# Patient Record
Sex: Male | Born: 1965 | Race: White | Hispanic: No | Marital: Married | Smoking: Former smoker
Health system: Southern US, Community
[De-identification: ages and names within clinical notes are randomized; demographics above are authoritative.]

## PROBLEM LIST (undated history)

## (undated) DIAGNOSIS — I10 Essential (primary) hypertension: Secondary | ICD-10-CM

## (undated) DIAGNOSIS — Z8719 Personal history of other diseases of the digestive system: Secondary | ICD-10-CM

## (undated) DIAGNOSIS — K219 Gastro-esophageal reflux disease without esophagitis: Secondary | ICD-10-CM

## (undated) DIAGNOSIS — Z87442 Personal history of urinary calculi: Secondary | ICD-10-CM

## (undated) DIAGNOSIS — R7303 Prediabetes: Secondary | ICD-10-CM

---

## 2002-08-10 HISTORY — PX: VASECTOMY: SHX75

## 2005-01-01 ENCOUNTER — Emergency Department (HOSPITAL_COMMUNITY): Admission: EM | Admit: 2005-01-01 | Discharge: 2005-01-01 | Payer: Self-pay | Admitting: Emergency Medicine

## 2006-05-19 IMAGING — CT CT ABDOMEN W/O CM
1 of 2 series · 15 of 32 positions shown, 19 images · non-contrast
Comparison: none

CLINICAL DATA: Left flank pain

ABDOMEN CT WITHOUT CONTRAST - URINARY STONE PROTOCOL
TECHNIQUE: Multidetector CT imaging of the abdomen was performed following the
urinary stone protocol.  No oral or intravenous contrast was administered.
TECHNIQUE: Multidetector CT imaging of the pelvis was performed following the

[Series 2: abd pelvis · axial · 0.74mm/px · z∈[-470,-65]mm · 15 of 91 slices shown, 19 images]
[im 5/91  soft-tissue]
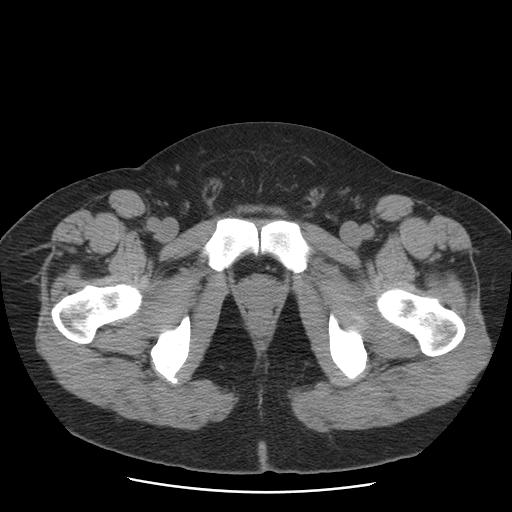
[im 5/91  bone]
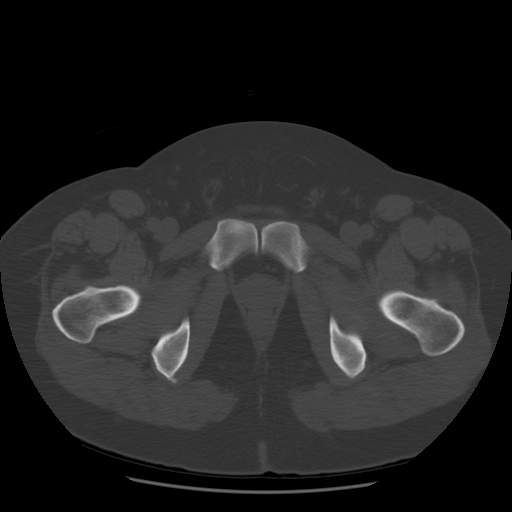
[im 13/91  soft-tissue]
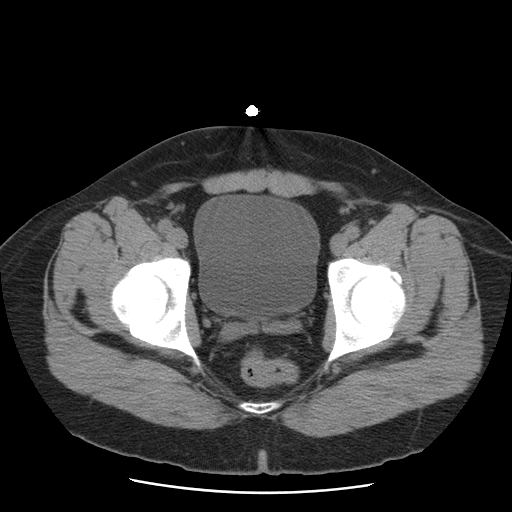
[im 21/91  soft-tissue]
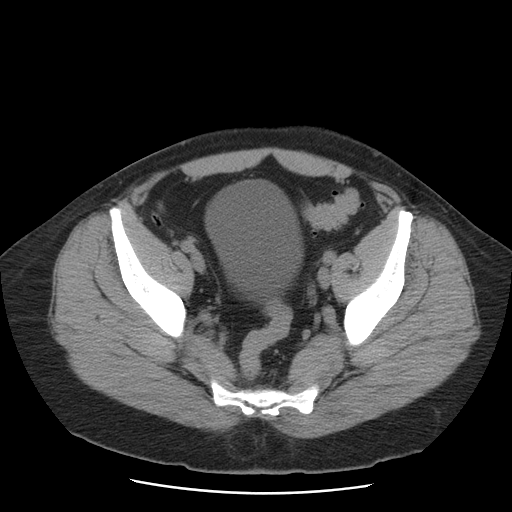
[im 25/91  soft-tissue]
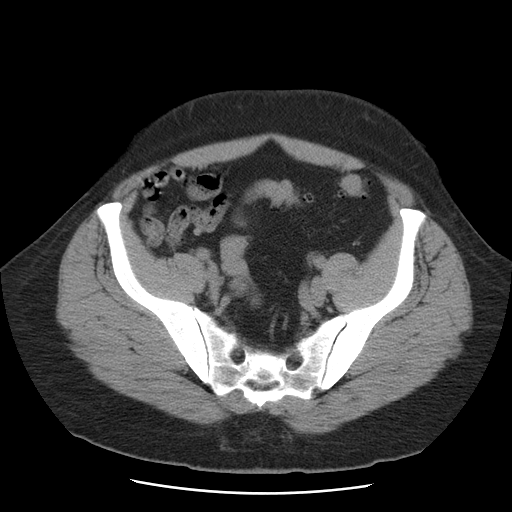
[im 33/91  soft-tissue]
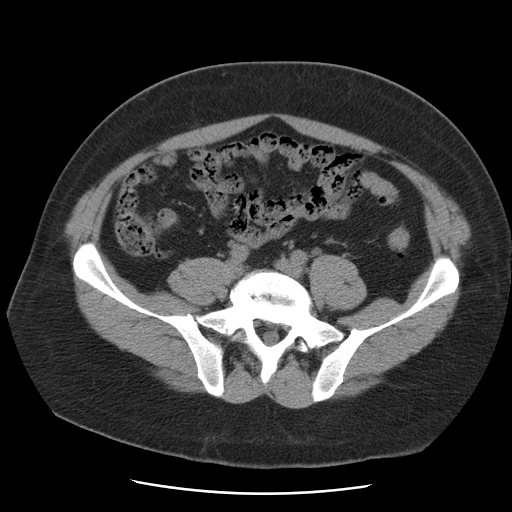
[im 37/91  soft-tissue]
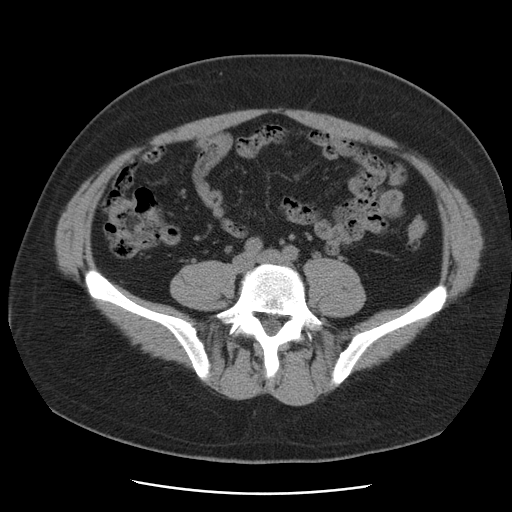
[im 46/91  soft-tissue]
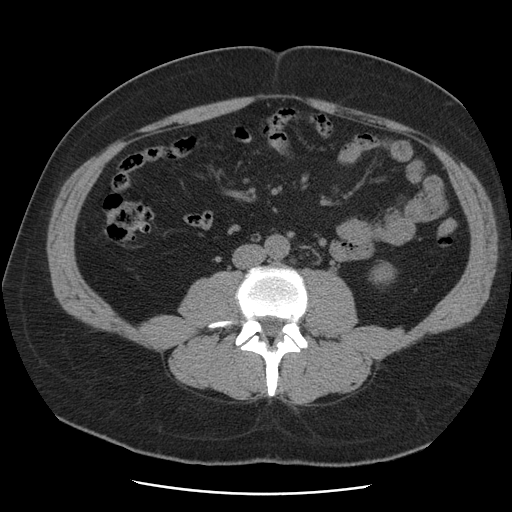
[im 54/91  soft-tissue]
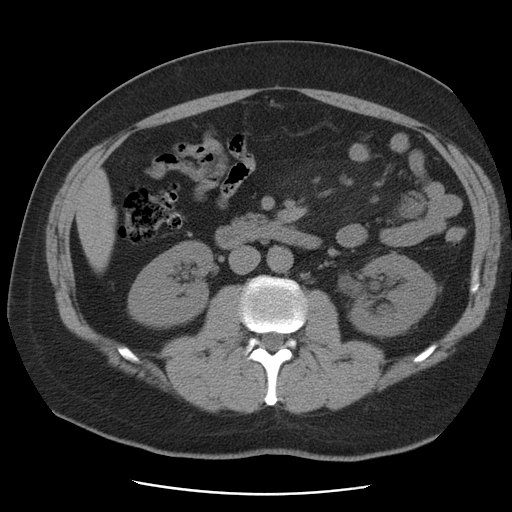
[im 58/91  soft-tissue]
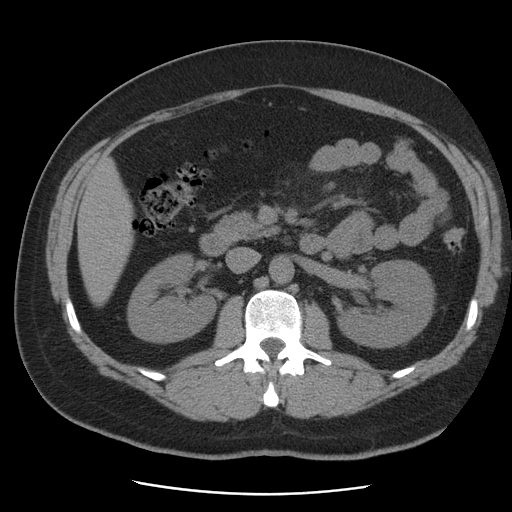
[im 58/91  bone]
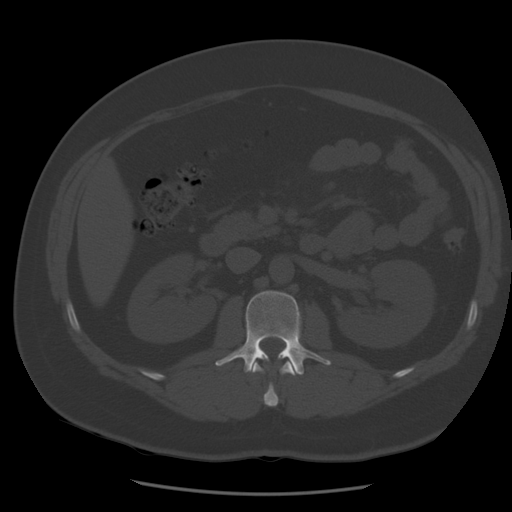
[im 66/91  soft-tissue]
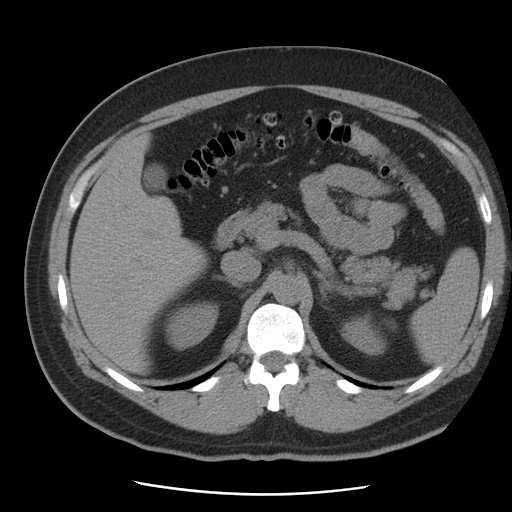
[im 70/91  soft-tissue]
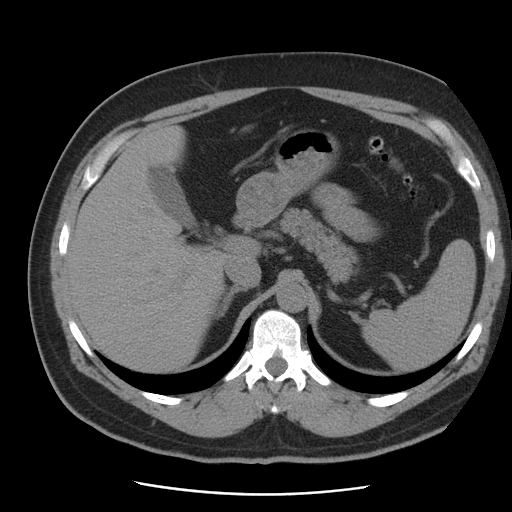
[im 74/91  lung]
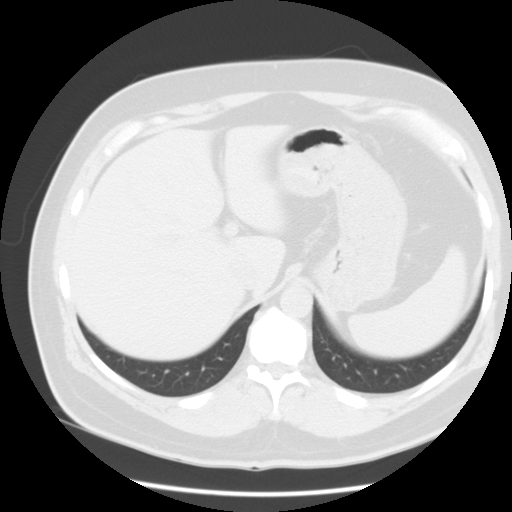
[im 78/91  soft-tissue]
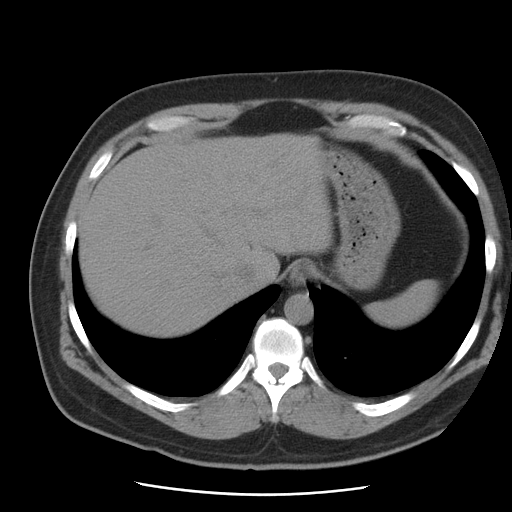
[im 78/91  lung]
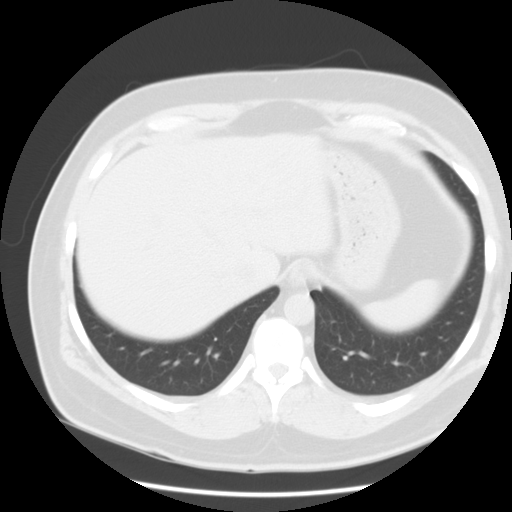
[im 82/91  lung]
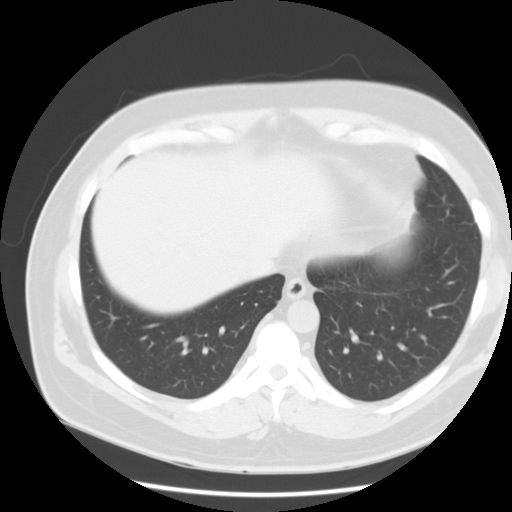
[im 86/91  soft-tissue]
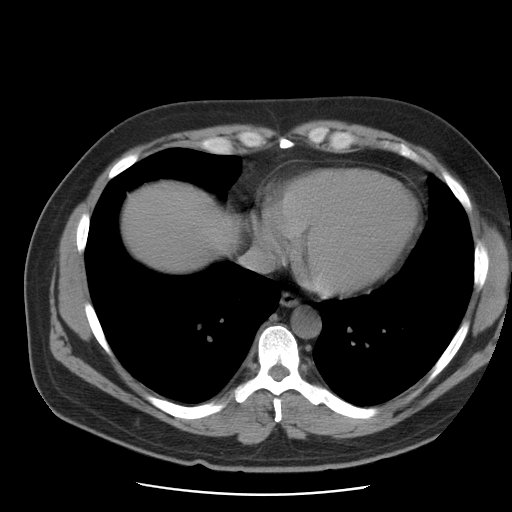
[im 86/91  lung]
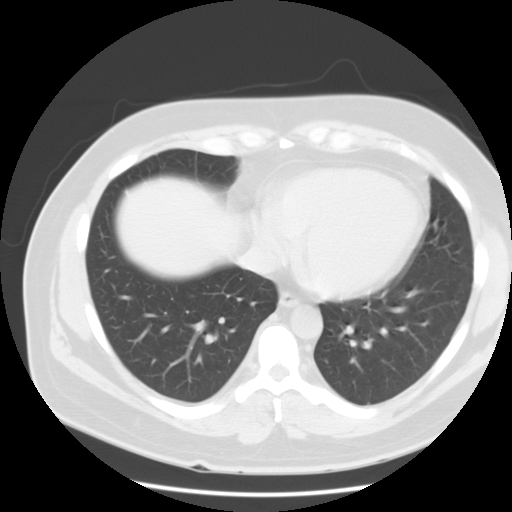

[15 of 32 positions shown; findings below may reference images not displayed]

FINDINGS: There is mild left hydroureteronephrosis. No stones seen in the
kidneys or proximal ureters. See pelvic CT report below.

Extensive diverticular disease seen throughout the entire colon. No evidence of
active diverticulitis. Eye orbits have an unremarkable uninfused appearance.

IMPRESSION

Mild left hydroureteronephrosis. 

Diffuse colonic diverticulosis.

PELVIS CT WITHOUT CONTRAST - URINARY STONE PROTOCOL
FINDINGS: There is a punctate stone at the left ureterovesical junction. Right
ureter unremarkable. Extensive diverticular disease seen throughout the colon.
No free fluid, free air, or adenopathy.

IMPRESSION

Punctate stone left UVJ.

## 2016-09-01 DIAGNOSIS — I10 Essential (primary) hypertension: Secondary | ICD-10-CM | POA: Diagnosis not present

## 2016-09-01 DIAGNOSIS — Z1211 Encounter for screening for malignant neoplasm of colon: Secondary | ICD-10-CM | POA: Diagnosis not present

## 2016-09-01 DIAGNOSIS — E559 Vitamin D deficiency, unspecified: Secondary | ICD-10-CM | POA: Diagnosis not present

## 2016-09-01 DIAGNOSIS — E785 Hyperlipidemia, unspecified: Secondary | ICD-10-CM | POA: Diagnosis not present

## 2016-10-27 DIAGNOSIS — D126 Benign neoplasm of colon, unspecified: Secondary | ICD-10-CM | POA: Diagnosis not present

## 2016-10-27 DIAGNOSIS — Z1211 Encounter for screening for malignant neoplasm of colon: Secondary | ICD-10-CM | POA: Diagnosis not present

## 2016-10-27 DIAGNOSIS — K529 Noninfective gastroenteritis and colitis, unspecified: Secondary | ICD-10-CM | POA: Diagnosis not present

## 2016-10-29 DIAGNOSIS — D126 Benign neoplasm of colon, unspecified: Secondary | ICD-10-CM | POA: Diagnosis not present

## 2016-10-29 DIAGNOSIS — Z1211 Encounter for screening for malignant neoplasm of colon: Secondary | ICD-10-CM | POA: Diagnosis not present

## 2016-10-29 DIAGNOSIS — K529 Noninfective gastroenteritis and colitis, unspecified: Secondary | ICD-10-CM | POA: Diagnosis not present

## 2016-12-03 DIAGNOSIS — K5730 Diverticulosis of large intestine without perforation or abscess without bleeding: Secondary | ICD-10-CM | POA: Diagnosis not present

## 2016-12-03 DIAGNOSIS — K529 Noninfective gastroenteritis and colitis, unspecified: Secondary | ICD-10-CM | POA: Diagnosis not present

## 2016-12-03 DIAGNOSIS — Z86010 Personal history of colonic polyps: Secondary | ICD-10-CM | POA: Diagnosis not present

## 2017-04-28 DIAGNOSIS — H5213 Myopia, bilateral: Secondary | ICD-10-CM | POA: Diagnosis not present

## 2017-08-26 DIAGNOSIS — E559 Vitamin D deficiency, unspecified: Secondary | ICD-10-CM | POA: Diagnosis not present

## 2017-08-26 DIAGNOSIS — K429 Umbilical hernia without obstruction or gangrene: Secondary | ICD-10-CM | POA: Diagnosis not present

## 2017-08-26 DIAGNOSIS — E785 Hyperlipidemia, unspecified: Secondary | ICD-10-CM | POA: Diagnosis not present

## 2017-08-26 DIAGNOSIS — I10 Essential (primary) hypertension: Secondary | ICD-10-CM | POA: Diagnosis not present

## 2017-09-28 DIAGNOSIS — E559 Vitamin D deficiency, unspecified: Secondary | ICD-10-CM | POA: Diagnosis not present

## 2017-09-28 DIAGNOSIS — E785 Hyperlipidemia, unspecified: Secondary | ICD-10-CM | POA: Diagnosis not present

## 2017-09-28 DIAGNOSIS — I10 Essential (primary) hypertension: Secondary | ICD-10-CM | POA: Diagnosis not present

## 2017-09-28 DIAGNOSIS — Z125 Encounter for screening for malignant neoplasm of prostate: Secondary | ICD-10-CM | POA: Diagnosis not present

## 2017-10-19 DIAGNOSIS — F4322 Adjustment disorder with anxiety: Secondary | ICD-10-CM | POA: Diagnosis not present

## 2017-10-26 DIAGNOSIS — F4322 Adjustment disorder with anxiety: Secondary | ICD-10-CM | POA: Diagnosis not present

## 2017-11-18 DIAGNOSIS — F4322 Adjustment disorder with anxiety: Secondary | ICD-10-CM | POA: Diagnosis not present

## 2017-12-09 DIAGNOSIS — F4322 Adjustment disorder with anxiety: Secondary | ICD-10-CM | POA: Diagnosis not present

## 2018-02-03 DIAGNOSIS — E785 Hyperlipidemia, unspecified: Secondary | ICD-10-CM | POA: Diagnosis not present

## 2018-02-03 DIAGNOSIS — I10 Essential (primary) hypertension: Secondary | ICD-10-CM | POA: Diagnosis not present

## 2018-02-03 DIAGNOSIS — E669 Obesity, unspecified: Secondary | ICD-10-CM | POA: Diagnosis not present

## 2018-02-03 DIAGNOSIS — E559 Vitamin D deficiency, unspecified: Secondary | ICD-10-CM | POA: Diagnosis not present

## 2018-04-28 DIAGNOSIS — E785 Hyperlipidemia, unspecified: Secondary | ICD-10-CM | POA: Diagnosis not present

## 2018-04-28 DIAGNOSIS — E559 Vitamin D deficiency, unspecified: Secondary | ICD-10-CM | POA: Diagnosis not present

## 2018-04-28 DIAGNOSIS — E669 Obesity, unspecified: Secondary | ICD-10-CM | POA: Diagnosis not present

## 2018-04-28 DIAGNOSIS — I10 Essential (primary) hypertension: Secondary | ICD-10-CM | POA: Diagnosis not present

## 2018-04-28 DIAGNOSIS — Z1159 Encounter for screening for other viral diseases: Secondary | ICD-10-CM | POA: Diagnosis not present

## 2019-02-07 DIAGNOSIS — F4321 Adjustment disorder with depressed mood: Secondary | ICD-10-CM | POA: Diagnosis not present

## 2019-02-07 DIAGNOSIS — E785 Hyperlipidemia, unspecified: Secondary | ICD-10-CM | POA: Diagnosis not present

## 2019-02-07 DIAGNOSIS — I10 Essential (primary) hypertension: Secondary | ICD-10-CM | POA: Diagnosis not present

## 2019-02-07 DIAGNOSIS — E559 Vitamin D deficiency, unspecified: Secondary | ICD-10-CM | POA: Diagnosis not present

## 2019-08-07 DIAGNOSIS — Z20828 Contact with and (suspected) exposure to other viral communicable diseases: Secondary | ICD-10-CM | POA: Diagnosis not present

## 2019-12-04 DIAGNOSIS — E669 Obesity, unspecified: Secondary | ICD-10-CM | POA: Diagnosis not present

## 2019-12-04 DIAGNOSIS — E559 Vitamin D deficiency, unspecified: Secondary | ICD-10-CM | POA: Diagnosis not present

## 2019-12-04 DIAGNOSIS — I10 Essential (primary) hypertension: Secondary | ICD-10-CM | POA: Diagnosis not present

## 2019-12-04 DIAGNOSIS — E785 Hyperlipidemia, unspecified: Secondary | ICD-10-CM | POA: Diagnosis not present

## 2019-12-14 DIAGNOSIS — Z131 Encounter for screening for diabetes mellitus: Secondary | ICD-10-CM | POA: Diagnosis not present

## 2019-12-14 DIAGNOSIS — I10 Essential (primary) hypertension: Secondary | ICD-10-CM | POA: Diagnosis not present

## 2019-12-14 DIAGNOSIS — Z0183 Encounter for blood typing: Secondary | ICD-10-CM | POA: Diagnosis not present

## 2019-12-14 DIAGNOSIS — E785 Hyperlipidemia, unspecified: Secondary | ICD-10-CM | POA: Diagnosis not present

## 2019-12-14 DIAGNOSIS — E559 Vitamin D deficiency, unspecified: Secondary | ICD-10-CM | POA: Diagnosis not present

## 2020-07-02 DIAGNOSIS — I10 Essential (primary) hypertension: Secondary | ICD-10-CM | POA: Diagnosis not present

## 2020-07-02 DIAGNOSIS — E785 Hyperlipidemia, unspecified: Secondary | ICD-10-CM | POA: Diagnosis not present

## 2020-07-02 DIAGNOSIS — E559 Vitamin D deficiency, unspecified: Secondary | ICD-10-CM | POA: Diagnosis not present

## 2020-07-02 DIAGNOSIS — E669 Obesity, unspecified: Secondary | ICD-10-CM | POA: Diagnosis not present

## 2021-04-21 DIAGNOSIS — Z713 Dietary counseling and surveillance: Secondary | ICD-10-CM | POA: Diagnosis not present

## 2021-04-21 DIAGNOSIS — E559 Vitamin D deficiency, unspecified: Secondary | ICD-10-CM | POA: Diagnosis not present

## 2021-04-21 DIAGNOSIS — E669 Obesity, unspecified: Secondary | ICD-10-CM | POA: Diagnosis not present

## 2021-04-21 DIAGNOSIS — Z125 Encounter for screening for malignant neoplasm of prostate: Secondary | ICD-10-CM | POA: Diagnosis not present

## 2021-04-21 DIAGNOSIS — E7800 Pure hypercholesterolemia, unspecified: Secondary | ICD-10-CM | POA: Diagnosis not present

## 2021-04-21 DIAGNOSIS — Z79899 Other long term (current) drug therapy: Secondary | ICD-10-CM | POA: Diagnosis not present

## 2021-04-24 DIAGNOSIS — Z0000 Encounter for general adult medical examination without abnormal findings: Secondary | ICD-10-CM | POA: Diagnosis not present

## 2021-04-24 DIAGNOSIS — R635 Abnormal weight gain: Secondary | ICD-10-CM | POA: Diagnosis not present

## 2021-04-24 DIAGNOSIS — I10 Essential (primary) hypertension: Secondary | ICD-10-CM | POA: Diagnosis not present

## 2021-04-24 DIAGNOSIS — E559 Vitamin D deficiency, unspecified: Secondary | ICD-10-CM | POA: Diagnosis not present

## 2021-04-24 DIAGNOSIS — E7800 Pure hypercholesterolemia, unspecified: Secondary | ICD-10-CM | POA: Diagnosis not present

## 2021-05-12 ENCOUNTER — Ambulatory Visit: Payer: Self-pay | Admitting: Surgery

## 2021-05-12 DIAGNOSIS — K436 Other and unspecified ventral hernia with obstruction, without gangrene: Secondary | ICD-10-CM | POA: Diagnosis not present

## 2021-05-12 DIAGNOSIS — K4020 Bilateral inguinal hernia, without obstruction or gangrene, not specified as recurrent: Secondary | ICD-10-CM | POA: Diagnosis not present

## 2021-05-12 DIAGNOSIS — Z6837 Body mass index (BMI) 37.0-37.9, adult: Secondary | ICD-10-CM | POA: Diagnosis not present

## 2021-05-12 DIAGNOSIS — M6208 Separation of muscle (nontraumatic), other site: Secondary | ICD-10-CM | POA: Diagnosis not present

## 2021-10-14 DIAGNOSIS — Z86010 Personal history of colonic polyps: Secondary | ICD-10-CM | POA: Diagnosis not present

## 2021-10-14 DIAGNOSIS — K5730 Diverticulosis of large intestine without perforation or abscess without bleeding: Secondary | ICD-10-CM | POA: Diagnosis not present

## 2021-10-14 DIAGNOSIS — K648 Other hemorrhoids: Secondary | ICD-10-CM | POA: Diagnosis not present

## 2021-10-28 DIAGNOSIS — E7800 Pure hypercholesterolemia, unspecified: Secondary | ICD-10-CM | POA: Diagnosis not present

## 2021-10-28 DIAGNOSIS — Z6836 Body mass index (BMI) 36.0-36.9, adult: Secondary | ICD-10-CM | POA: Diagnosis not present

## 2021-10-28 DIAGNOSIS — E559 Vitamin D deficiency, unspecified: Secondary | ICD-10-CM | POA: Diagnosis not present

## 2021-10-28 DIAGNOSIS — R7303 Prediabetes: Secondary | ICD-10-CM | POA: Diagnosis not present

## 2021-10-28 DIAGNOSIS — I10 Essential (primary) hypertension: Secondary | ICD-10-CM | POA: Diagnosis not present

## 2021-10-28 DIAGNOSIS — Z79899 Other long term (current) drug therapy: Secondary | ICD-10-CM | POA: Diagnosis not present

## 2021-10-29 ENCOUNTER — Encounter (HOSPITAL_COMMUNITY): Payer: Self-pay

## 2021-10-29 ENCOUNTER — Other Ambulatory Visit: Payer: Self-pay

## 2021-10-29 NOTE — Patient Instructions (Signed)
DUE TO COVID-19 ONLY ONE VISITOR  (aged 56 and older)  IS ALLOWED TO COME WITH YOU AND STAY IN THE WAITING ROOM ONLY DURING PRE OP AND PROCEDURE.   ?**NO VISITORS ARE ALLOWED IN THE SHORT STAY AREA OR RECOVERY ROOM!!** ? ?IF YOU WILL BE ADMITTED INTO THE HOSPITAL YOU ARE ALLOWED ONLY TWO SUPPORT PEOPLE DURING VISITATION HOURS ONLY (7 AM -8PM)   ?The support person(s) must pass our screening, gel in and out, and wear a mask at all times, including in the patient?s room. ?Patients must also wear a mask when staff or their support person are in the room. ?Visitors GUEST BADGE MUST BE WORN VISIBLY  ?One adult visitor may remain with you overnight and MUST be in the room by 8 P.M. ?  ? ? Your procedure is scheduled on: 11/07/21 ? ? Report to [facility] Main Entrance ? ?  Report to admitting at  11:00 AM ? ? Call this number if you have problems the morning of surgery [phone] ? ? Do not eat food :After Midnight. ? ? After Midnight you may have the following liquids until _10:00_____ AM DAY OF SURGERY ? ?Water ?Black Coffee (sugar ok, NO MILK/CREAM OR CREAMERS)  ?Tea (sugar ok, NO MILK/CREAM OR CREAMERS) regular and decaf                             ?Plain Jell-O (NO RED)                                           ?Fruit ices (not with fruit pulp, NO RED)                                     ?Popsicles (NO RED)                                                                  ?Juice: apple, WHITE grape, WHITE cranberry ?Sports drinks like Gatorade (NO RED) ?Clear broth(vegetable,chicken,beef) ? ?             ? ?  ?       If you have questions, please contact your surgeon?s office. ? ? ?FOLLOW BOWEL PREP AND ANY ADDITIONAL PRE OP INSTRUCTIONS YOU RECEIVED FROM YOUR SURGEON'S OFFICE!!! ?  ?  ?Oral Hygiene is also important to reduce your risk of infection.                                    ?Remember - BRUSH YOUR TEETH THE MORNING OF SURGERY WITH YOUR REGULAR TOOTHPASTE ? ? Do NOT smoke after Midnight ? ? Take  these medicines the morning of surgery with A SIP OF WATER: Amlodipine ? ? ?                  ?           You may not have any metal on your body including hair pins,  jewelry, and body piercing ? ?           Do not wear make-up, lotions, powders, perfumes/cologne, or deodorant ?  ?  ? ?            Men may shave face and neck. ? ? Do not bring valuables to the hospital. [facility] IS NOT ?            RESPONSIBLE   FOR VALUABLES. ? ? Contacts, dentures or bridgework may not be worn into surgery. ? ?  ? Patients discharged on the day of surgery will not be allowed to drive home.  Someone NEEDS to stay with you for the first 24 hours after anesthesia. ? ? Special Instructions: Bring a copy of your healthcare power of attorney and living will documents  the day of surgery if you haven't scanned them before. ? ?            Please read over the following fact sheets you were given: IF YOU HAVE QUESTIONS ABOUT YOUR PRE-OP INSTRUCTIONS PLEASE CALL [phone] ? ?   [facility] [facility] - Preparing for Surgery ?Before surgery, you can play an important role.  Because skin is not sterile, your skin needs to be as free of germs as possible.  You can reduce the number of germs on your skin by washing with CHG (chlorahexidine gluconate) soap before surgery.  CHG is an antiseptic cleaner which kills germs and bonds with the skin to continue killing germs even after washing. ?Please DO NOT use if you have an allergy to CHG or antibacterial soaps.  If your skin becomes reddened/irritated stop using the CHG and inform your nurse when you arrive at Short Stay.  You may shave your face/neck. ?Please follow these instructions carefully: ? 1.  Shower with CHG Soap the night before surgery and the  morning of Surgery. ? 2.  If you choose to wash your hair, wash your hair first as usual with your  normal  shampoo. ? 3.  After you shampoo, rinse your hair and body thoroughly to remove the  shampoo.                           ? 4.  Use CHG as you  would any other liquid soap.  You can apply chg directly  to the skin and wash  ?                     Gently with a scrungie or clean washcloth. ? 5.  Apply the CHG Soap to your body ONLY FROM THE NECK DOWN.   Do not use on face/ open      ?                     Wound or open sores. Avoid contact with eyes, ears mouth and genitals (private parts).  ?                     Engineering geologist,  Genitals (private parts) with your normal soap. ?            6.  Wash thoroughly, paying special attention to the area where your surgery  will be performed. ? 7.  Thoroughly rinse your body with warm water from the neck down. ? 8.  DO NOT shower/wash with your normal soap after using and rinsing off  the CHG Soap. ?  9.  Pat yourself dry with a clean towel. ?           10.  Wear clean pajamas. ?           11.  Place clean sheets on your bed the night of your first shower and do not  sleep with pets. ?Day of Surgery : ?Do not apply any lotions/deodorants the morning of surgery.  Please wear clean clothes to the hospital/surgery center. ? ?FAILURE TO FOLLOW THESE INSTRUCTIONS [place] RESULT IN THE CANCELLATION OF YOUR SURGERY ?PATIENT SIGNATURE_________________________________ ? ?NURSE SIGNATURE__________________________________ ? ?________________________________________________________________________  ?

## 2021-10-30 ENCOUNTER — Encounter (HOSPITAL_COMMUNITY): Payer: Self-pay

## 2021-10-30 ENCOUNTER — Encounter (INDEPENDENT_AMBULATORY_CARE_PROVIDER_SITE_OTHER): Payer: Self-pay

## 2021-10-30 ENCOUNTER — Other Ambulatory Visit: Payer: Self-pay

## 2021-10-30 ENCOUNTER — Encounter (HOSPITAL_COMMUNITY)
Admission: RE | Admit: 2021-10-30 | Discharge: 2021-10-30 | Disposition: A | Discharge status: Home / Self Care | Payer: BC Managed Care – PPO | Source: Ambulatory Visit | Attending: Surgery | Admitting: Surgery

## 2021-10-30 VITALS — HR 71 | Temp 99.1°F | Resp 16

## 2021-10-30 DIAGNOSIS — R7303 Prediabetes: Secondary | ICD-10-CM | POA: Insufficient documentation

## 2021-10-30 DIAGNOSIS — Z01818 Encounter for other preprocedural examination: Secondary | ICD-10-CM | POA: Insufficient documentation

## 2021-10-30 HISTORY — DX: Personal history of other diseases of the digestive system: Z87.19

## 2021-10-30 HISTORY — DX: Personal history of urinary calculi: Z87.442

## 2021-10-30 HISTORY — DX: Gastro-esophageal reflux disease without esophagitis: K21.9

## 2021-10-30 HISTORY — DX: Prediabetes: R73.03

## 2021-10-30 HISTORY — DX: Essential (primary) hypertension: I10

## 2021-10-30 LAB — BASIC METABOLIC PANEL
Anion gap: 7 (ref 5–15)
BUN: 20 mg/dL (ref 6–20)
CO2: 28 mmol/L (ref 22–32)
Calcium: 9.7 mg/dL (ref 8.9–10.3)
Chloride: 104 mmol/L (ref 98–111)
Creatinine, Ser: 0.96 mg/dL (ref 0.61–1.24)
GFR, Estimated: >60 mL/min (ref >60)
Glucose, Bld: 106 mg/dL — ABNORMAL HIGH (ref 70–99)
Potassium: 3.3 mmol/L — ABNORMAL LOW (ref 3.5–5.1)
Sodium: 139 mmol/L (ref 135–145)

## 2021-10-30 LAB — HEMOGLOBIN A1C
Hgb A1c MFr Bld: 5.5 % (ref 4.8–5.6)
Mean Plasma Glucose: 111.15 mg/dL

## 2021-10-30 LAB — CBC
HCT: 47.8 % (ref 39.0–52.0)
Hemoglobin: 15.9 g/dL (ref 13.0–17.0)
MCH: 28.5 pg (ref 26.0–34.0)
MCHC: 33.3 g/dL (ref 30.0–36.0)
MCV: 85.7 fL (ref 80.0–100.0)
Platelets: 214 10*3/uL (ref 150–400)
RBC: 5.58 MIL/uL (ref 4.22–5.81)
RDW: 12.8 % (ref 11.5–15.5)
WBC: 7.5 10*3/uL (ref 4.0–10.5)
nRBC: 0 % (ref 0.0–0.2)

## 2021-10-30 LAB — GLUCOSE, CAPILLARY: Glucose-Capillary: 117 mg/dL — ABNORMAL HIGH (ref 70–99)

## 2021-10-30 NOTE — Progress Notes (Signed)
?  Bowel prep reminder:NA ? ?PCP - Dr. Karna Dupes ?Cardiologist - no ? ?Chest x-ray - no ?EKG - 10/30/21-chart ?Stress Test - no ?ECHO - no ?Cardiac Cath - no ?Pacemaker/ICD device last checked:NA ? ?Sleep Study - no ?CPAP -  ? ?Fasting Blood Sugar - pre-diabetic ?Checks Blood Sugar _____ times a day ? ?Blood Thinner Instructions:NA ?Aspirin Instructions: ?Last Dose: ? ?Anesthesia review: no ? ?Patient denies shortness of breath, fever, cough and chest pain at PAT appointment ?Pt has no SOB with activities. ? ?Patient verbalized understanding of instructions that were given to them at the PAT appointment. Patient was also instructed that they will need to review over the PAT instructions again at home before surgery. yes ?

## 2021-11-07 ENCOUNTER — Encounter (HOSPITAL_COMMUNITY): Payer: Self-pay | Admitting: Surgery

## 2021-11-07 ENCOUNTER — Other Ambulatory Visit: Payer: Self-pay

## 2021-11-07 ENCOUNTER — Ambulatory Visit (HOSPITAL_COMMUNITY): Payer: BC Managed Care – PPO | Admitting: Certified Registered"

## 2021-11-07 ENCOUNTER — Encounter (HOSPITAL_COMMUNITY)
Admission: RE | Disposition: A | Discharge status: Home / Self Care | Payer: Self-pay | Source: Ambulatory Visit | Attending: Surgery

## 2021-11-07 ENCOUNTER — Ambulatory Visit (HOSPITAL_COMMUNITY)
Admission: RE | Admit: 2021-11-07 | Discharge: 2021-11-07 | Disposition: A | Discharge status: Home / Self Care | Payer: BC Managed Care – PPO | Source: Ambulatory Visit | Attending: Surgery | Admitting: Surgery

## 2021-11-07 DIAGNOSIS — K4190 Unilateral femoral hernia, without obstruction or gangrene, not specified as recurrent: Secondary | ICD-10-CM | POA: Insufficient documentation

## 2021-11-07 DIAGNOSIS — M6208 Separation of muscle (nontraumatic), other site: Secondary | ICD-10-CM | POA: Diagnosis not present

## 2021-11-07 DIAGNOSIS — Z87891 Personal history of nicotine dependence: Secondary | ICD-10-CM | POA: Diagnosis not present

## 2021-11-07 DIAGNOSIS — K436 Other and unspecified ventral hernia with obstruction, without gangrene: Secondary | ICD-10-CM | POA: Diagnosis not present

## 2021-11-07 DIAGNOSIS — K4020 Bilateral inguinal hernia, without obstruction or gangrene, not specified as recurrent: Secondary | ICD-10-CM | POA: Insufficient documentation

## 2021-11-07 DIAGNOSIS — Z6835 Body mass index (BMI) 35.0-35.9, adult: Secondary | ICD-10-CM | POA: Insufficient documentation

## 2021-11-07 DIAGNOSIS — E669 Obesity, unspecified: Secondary | ICD-10-CM | POA: Insufficient documentation

## 2021-11-07 DIAGNOSIS — I10 Essential (primary) hypertension: Secondary | ICD-10-CM | POA: Insufficient documentation

## 2021-11-07 DIAGNOSIS — Z79899 Other long term (current) drug therapy: Secondary | ICD-10-CM | POA: Insufficient documentation

## 2021-11-07 HISTORY — PX: VENTRAL HERNIA REPAIR: SHX424

## 2021-11-07 HISTORY — PX: INGUINAL HERNIA REPAIR: SHX194

## 2021-11-07 LAB — GLUCOSE, CAPILLARY: Glucose-Capillary: 160 mg/dL — ABNORMAL HIGH (ref 70–99)

## 2021-11-07 SURGERY — REPAIR, HERNIA, VENTRAL, LAPAROSCOPIC
Anesthesia: General

## 2021-11-07 MED ORDER — ROCURONIUM BROMIDE 10 MG/ML (PF) SYRINGE
PREFILLED_SYRINGE | INTRAVENOUS | Status: AC
  Filled 2021-11-07: qty 10

## 2021-11-07 MED ORDER — GENTAMICIN SULFATE 40 MG/ML IJ SOLN
5.0000 mg/kg | INTRAVENOUS | Status: AC
  Administered 2021-11-07: 460 mg via INTRAVENOUS
  Filled 2021-11-07: qty 11.5

## 2021-11-07 MED ORDER — KETOROLAC TROMETHAMINE 30 MG/ML IJ SOLN
INTRAMUSCULAR | Status: DC | PRN
  Administered 2021-11-07: 30 mg via INTRAVENOUS

## 2021-11-07 MED ORDER — PROPOFOL 10 MG/ML IV BOLUS
INTRAVENOUS | Status: DC | PRN
  Administered 2021-11-07: 200 mg via INTRAVENOUS

## 2021-11-07 MED ORDER — KETAMINE HCL 50 MG/5ML IJ SOSY
PREFILLED_SYRINGE | INTRAMUSCULAR | Status: AC
  Filled 2021-11-07: qty 5

## 2021-11-07 MED ORDER — KETAMINE HCL 10 MG/ML IJ SOLN
INTRAMUSCULAR | Status: DC | PRN
  Administered 2021-11-07 (×2): 15 mg via INTRAVENOUS

## 2021-11-07 MED ORDER — ROCURONIUM BROMIDE 10 MG/ML (PF) SYRINGE
PREFILLED_SYRINGE | INTRAVENOUS | Status: DC | PRN
  Administered 2021-11-07: 80 mg via INTRAVENOUS
  Administered 2021-11-07 (×2): 20 mg via INTRAVENOUS
  Administered 2021-11-07: 10 mg via INTRAVENOUS

## 2021-11-07 MED ORDER — ACETAMINOPHEN 500 MG PO TABS
1000.0000 mg | ORAL_TABLET | ORAL | Status: AC
  Administered 2021-11-07: 1000 mg via ORAL
  Filled 2021-11-07: qty 2

## 2021-11-07 MED ORDER — ENSURE PRE-SURGERY PO LIQD: 296.0000 mL | Freq: Once | ORAL | Status: DC

## 2021-11-07 MED ORDER — 0.9 % SODIUM CHLORIDE (POUR BTL) OPTIME
TOPICAL | Status: DC | PRN
  Administered 2021-11-07: 1000 mL

## 2021-11-07 MED ORDER — CHLORHEXIDINE GLUCONATE CLOTH 2 % EX PADS: 6.0000 | MEDICATED_PAD | Freq: Once | CUTANEOUS | Status: DC

## 2021-11-07 MED ORDER — LACTATED RINGERS IV SOLN: INTRAVENOUS | Status: DC

## 2021-11-07 MED ORDER — OXYCODONE HCL 5 MG/5ML PO SOLN: 5.0000 mg | Freq: Once | ORAL | Status: DC | PRN

## 2021-11-07 MED ORDER — MIDAZOLAM HCL 2 MG/2ML IJ SOLN
INTRAMUSCULAR | Status: AC
  Filled 2021-11-07: qty 2

## 2021-11-07 MED ORDER — ORAL CARE MOUTH RINSE: 15.0000 mL | Freq: Once | OROMUCOSAL | Status: AC

## 2021-11-07 MED ORDER — DEXAMETHASONE SODIUM PHOSPHATE 10 MG/ML IJ SOLN
INTRAMUSCULAR | Status: AC
  Filled 2021-11-07: qty 1

## 2021-11-07 MED ORDER — PROPOFOL 10 MG/ML IV BOLUS
INTRAVENOUS | Status: AC
  Filled 2021-11-07: qty 20

## 2021-11-07 MED ORDER — BUPIVACAINE LIPOSOME 1.3 % IJ SUSP
INTRAMUSCULAR | Status: AC
  Filled 2021-11-07: qty 20

## 2021-11-07 MED ORDER — GABAPENTIN 300 MG PO CAPS
300.0000 mg | ORAL_CAPSULE | ORAL | Status: AC
Start: 2021-11-08 — End: 2021-11-07
  Administered 2021-11-07: 300 mg via ORAL
  Filled 2021-11-07: qty 1

## 2021-11-07 MED ORDER — SUGAMMADEX SODIUM 500 MG/5ML IV SOLN
INTRAVENOUS | Status: AC
  Filled 2021-11-07: qty 5

## 2021-11-07 MED ORDER — DEXAMETHASONE SODIUM PHOSPHATE 10 MG/ML IJ SOLN
INTRAMUSCULAR | Status: DC | PRN
  Administered 2021-11-07: 4 mg via INTRAVENOUS

## 2021-11-07 MED ORDER — FENTANYL CITRATE (PF) 100 MCG/2ML IJ SOLN
INTRAMUSCULAR | Status: DC | PRN
  Administered 2021-11-07: 25 ug via INTRAVENOUS
  Administered 2021-11-07: 50 ug via INTRAVENOUS
  Administered 2021-11-07: 100 ug via INTRAVENOUS
  Administered 2021-11-07: 25 ug via INTRAVENOUS
  Administered 2021-11-07: 50 ug via INTRAVENOUS
  Administered 2021-11-07 (×2): 25 ug via INTRAVENOUS

## 2021-11-07 MED ORDER — BUPIVACAINE LIPOSOME 1.3 % IJ SUSP
INTRAMUSCULAR | Status: DC | PRN
  Administered 2021-11-07: 20 mL

## 2021-11-07 MED ORDER — BUPIVACAINE-EPINEPHRINE 0.25% -1:200000 IJ SOLN
INTRAMUSCULAR | Status: DC | PRN
  Administered 2021-11-07: 60 mL

## 2021-11-07 MED ORDER — HYDROMORPHONE HCL 1 MG/ML IJ SOLN
INTRAMUSCULAR | Status: AC
  Filled 2021-11-07: qty 1

## 2021-11-07 MED ORDER — MIDAZOLAM HCL 2 MG/2ML IJ SOLN
INTRAMUSCULAR | Status: DC | PRN
  Administered 2021-11-07: 2 mg via INTRAVENOUS

## 2021-11-07 MED ORDER — SUGAMMADEX SODIUM 500 MG/5ML IV SOLN
INTRAVENOUS | Status: DC | PRN
  Administered 2021-11-07: 400 mg via INTRAVENOUS

## 2021-11-07 MED ORDER — ONDANSETRON HCL 4 MG/2ML IJ SOLN: 4.0000 mg | Freq: Once | INTRAMUSCULAR | Status: DC | PRN

## 2021-11-07 MED ORDER — OXYCODONE HCL 5 MG PO TABS: 5.0000 mg | ORAL_TABLET | Freq: Four times a day (QID) | ORAL | 0 refills | Status: AC | PRN

## 2021-11-07 MED ORDER — FENTANYL CITRATE (PF) 100 MCG/2ML IJ SOLN
INTRAMUSCULAR | Status: AC
  Filled 2021-11-07: qty 2

## 2021-11-07 MED ORDER — KETOROLAC TROMETHAMINE 30 MG/ML IJ SOLN: 30.0000 mg | Freq: Once | INTRAMUSCULAR | Status: DC | PRN

## 2021-11-07 MED ORDER — BUPIVACAINE-EPINEPHRINE (PF) 0.25% -1:200000 IJ SOLN
INTRAMUSCULAR | Status: AC
  Filled 2021-11-07: qty 60

## 2021-11-07 MED ORDER — FENTANYL CITRATE (PF) 100 MCG/2ML IJ SOLN
INTRAMUSCULAR | Status: AC
Start: 2021-11-07 — End: ?
  Filled 2021-11-07: qty 2

## 2021-11-07 MED ORDER — METHOCARBAMOL 750 MG PO TABS: 750.0000 mg | ORAL_TABLET | Freq: Four times a day (QID) | ORAL | 2 refills | Status: AC | PRN

## 2021-11-07 MED ORDER — BUPIVACAINE LIPOSOME 1.3 % IJ SUSP: 20.0000 mL | Freq: Once | INTRAMUSCULAR | Status: DC

## 2021-11-07 MED ORDER — CHLORHEXIDINE GLUCONATE 0.12 % MT SOLN
15.0000 mL | Freq: Once | OROMUCOSAL | Status: AC
  Administered 2021-11-07: 15 mL via OROMUCOSAL

## 2021-11-07 MED ORDER — CLINDAMYCIN PHOSPHATE 900 MG/50ML IV SOLN
900.0000 mg | INTRAVENOUS | Status: AC
  Administered 2021-11-07: 900 mg via INTRAVENOUS
  Filled 2021-11-07: qty 50

## 2021-11-07 MED ORDER — LIDOCAINE HCL (PF) 2 % IJ SOLN
INTRAMUSCULAR | Status: AC
  Filled 2021-11-07: qty 5

## 2021-11-07 MED ORDER — LIDOCAINE 2% (20 MG/ML) 5 ML SYRINGE
INTRAMUSCULAR | Status: DC | PRN
  Administered 2021-11-07: 100 mg via INTRAVENOUS

## 2021-11-07 MED ORDER — ONDANSETRON HCL 4 MG/2ML IJ SOLN
INTRAMUSCULAR | Status: DC | PRN
  Administered 2021-11-07: 4 mg via INTRAVENOUS

## 2021-11-07 MED ORDER — OXYCODONE HCL 5 MG PO TABS: 5.0000 mg | ORAL_TABLET | Freq: Once | ORAL | Status: DC | PRN

## 2021-11-07 MED ORDER — ONDANSETRON HCL 4 MG/2ML IJ SOLN
INTRAMUSCULAR | Status: AC
  Filled 2021-11-07: qty 2

## 2021-11-07 MED ORDER — ENSURE PRE-SURGERY PO LIQD: 592.0000 mL | Freq: Once | ORAL | Status: DC

## 2021-11-07 MED ORDER — HYDROMORPHONE HCL 1 MG/ML IJ SOLN
0.2500 mg | INTRAMUSCULAR | Status: DC | PRN
  Administered 2021-11-07: 0.25 mg via INTRAVENOUS

## 2021-11-07 SURGICAL SUPPLY — 54 items
APL PRP STRL LF DISP 70% ISPRP (MISCELLANEOUS) ×2
APPLIER CLIP 5 13 M/L LIGAMAX5 (MISCELLANEOUS)
APR CLP MED LRG 5 ANG JAW (MISCELLANEOUS)
BAG COUNTER SPONGE SURGICOUNT (BAG) ×3 IMPLANT
BAG SPNG CNTER NS LX DISP (BAG) ×2
BINDER ABDOMINAL 12 ML 46-62 (SOFTGOODS) ×1 IMPLANT
CABLE HIGH FREQUENCY MONO STRZ (ELECTRODE) ×3 IMPLANT
CHLORAPREP W/TINT 26 (MISCELLANEOUS) ×3 IMPLANT
CLIP APPLIE 5 13 M/L LIGAMAX5 (MISCELLANEOUS) IMPLANT
CLSR STERI-STRIP ANTIMIC 1/2X4 (GAUZE/BANDAGES/DRESSINGS) ×1 IMPLANT
COVER SURGICAL LIGHT HANDLE (MISCELLANEOUS) ×3 IMPLANT
DEVICE SECURE STRAP 25 ABSORB (INSTRUMENTS) ×1 IMPLANT
DEVICE TROCAR PUNCTURE CLOSURE (ENDOMECHANICALS) ×3 IMPLANT
DRAPE WARM FLUID 44X44 (DRAPES) ×3 IMPLANT
DRSG TEGADERM 2-3/8X2-3/4 SM (GAUZE/BANDAGES/DRESSINGS) ×7 IMPLANT
DRSG TEGADERM 4X4.75 (GAUZE/BANDAGES/DRESSINGS) ×3 IMPLANT
ELECT REM PT RETURN 15FT ADLT (MISCELLANEOUS) ×3 IMPLANT
GAUZE SPONGE 2X2 8PLY STRL LF (GAUZE/BANDAGES/DRESSINGS) ×2 IMPLANT
GLOVE SURG NEOPR MICRO LF SZ8 (GLOVE) ×3 IMPLANT
GLOVE SURG UNDER LTX SZ8 (GLOVE) ×3 IMPLANT
GOWN STRL REUS W/ TWL XL LVL3 (GOWN DISPOSABLE) ×6 IMPLANT
GOWN STRL REUS W/TWL XL LVL3 (GOWN DISPOSABLE) ×9
IRRIG SUCT STRYKERFLOW 2 WTIP (MISCELLANEOUS)
IRRIGATION SUCT STRKRFLW 2 WTP (MISCELLANEOUS) IMPLANT
KIT BASIN OR (CUSTOM PROCEDURE TRAY) ×3 IMPLANT
KIT TURNOVER KIT A (KITS) ×1 IMPLANT
MARKER SKIN DUAL TIP RULER LAB (MISCELLANEOUS) ×3 IMPLANT
MESH BARD SOFT 6X6IN (Mesh General) ×3 IMPLANT
MESH VENTRALIGHT ST 7X9N (Mesh General) ×1 IMPLANT
NDL INSUFFLATION 14GA 120MM (NEEDLE) IMPLANT
NDL SPNL 22GX3.5 QUINCKE BK (NEEDLE) IMPLANT
NEEDLE INSUFFLATION 14GA 120MM (NEEDLE) IMPLANT
NEEDLE SPNL 22GX3.5 QUINCKE BK (NEEDLE) ×3 IMPLANT
PAD POSITIONING PINK XL (MISCELLANEOUS) ×3 IMPLANT
PENCIL SMOKE EVACUATOR (MISCELLANEOUS) IMPLANT
SCISSORS LAP 5X35 DISP (ENDOMECHANICALS) ×3 IMPLANT
SET TUBE SMOKE EVAC HIGH FLOW (TUBING) ×3 IMPLANT
SLEEVE ADV FIXATION 5X100MM (TROCAR) ×3 IMPLANT
SPIKE FLUID TRANSFER (MISCELLANEOUS) ×3 IMPLANT
SPONGE GAUZE 2X2 STER 10/PKG (GAUZE/BANDAGES/DRESSINGS) ×1
STRIP CLOSURE SKIN 1/2X4 (GAUZE/BANDAGES/DRESSINGS) ×6 IMPLANT
SUT MNCRL AB 4-0 PS2 18 (SUTURE) ×3 IMPLANT
SUT PDS AB 1 CT1 27 (SUTURE) ×7 IMPLANT
SUT PROLENE 1 CT 1 30 (SUTURE) ×16 IMPLANT
SUT VIC AB 2-0 SH 27 (SUTURE) ×3
SUT VIC AB 2-0 SH 27X BRD (SUTURE) IMPLANT
SUT VICRYL 0 UR6 27IN ABS (SUTURE) ×3 IMPLANT
TACKER 5MM HERNIA 3.5CML NAB (ENDOMECHANICALS) IMPLANT
TOWEL OR 17X26 10 PK STRL BLUE (TOWEL DISPOSABLE) ×3 IMPLANT
TOWEL OR NON WOVEN STRL DISP B (DISPOSABLE) ×3 IMPLANT
TRAY LAPAROSCOPIC (CUSTOM PROCEDURE TRAY) ×3 IMPLANT
TROCAR ADV FIXATION 5X100MM (TROCAR) ×3 IMPLANT
TROCAR BLADELESS OPT 5 100 (ENDOMECHANICALS) ×3 IMPLANT
TROCAR XCEL BLUNT TIP 100MML (ENDOMECHANICALS) ×3 IMPLANT

## 2021-11-07 NOTE — Transfer of Care (Signed)
Immediate Anesthesia Transfer of Care Note ? ?Patient: Bruce Burton ? ?Procedure(s) Performed: LAPAROSCOPIC VENTRAL HERNIA REPAIR WITH MESH; REMOVAL OF SKIN TAG LEFT GROIN ?LAPAROSCOPIC BILATERAL INGUINAL HERNIA REPAIR WITH MESH (Bilateral) ? ?Patient Location: PACU ? ?Anesthesia Type:General ? ?Level of Consciousness: awake and alert  ? ?Airway & Oxygen Therapy: Patient Spontanous Breathing and Patient connected to face mask oxygen ? ?Post-op Assessment: Report given to RN and Post -op Vital signs reviewed and stable ? ?Post vital signs: Reviewed and stable ? ?Last Vitals:  ?Vitals Value Taken Time  ?BP 157/87 11/07/21 1821  ?Temp    ?Pulse 94 11/07/21 1825  ?Resp 17 11/07/21 1825  ?SpO2 92 % 11/07/21 1825  ?Vitals shown include unvalidated device data. ? ?Last Pain:  ?Vitals:  ? 11/07/21 1218  ?TempSrc:   ?PainSc: 0-No pain  ?   ? ?Patients Stated Pain Goal: 3 (11/07/21 1218) ? ?Complications: No notable events documented. ?

## 2021-11-07 NOTE — Anesthesia Procedure Notes (Signed)
Procedure Name: Intubation ?Date/Time: 11/07/2021 2:34 PM ?Performed by: Niel Hummer, CRNA ?Pre-anesthesia Checklist: Patient identified, Emergency Drugs available, Suction available and Patient being monitored ?Patient Re-evaluated:Patient Re-evaluated prior to induction ?Oxygen Delivery Method: Circle system utilized ?Preoxygenation: Pre-oxygenation with 100% oxygen ?Induction Type: IV induction ?Ventilation: Mask ventilation without difficulty and Oral airway inserted - appropriate to patient size ?Laryngoscope Size: Mac and 4 ?Grade View: Grade II ?Tube type: Oral ?Tube size: 7.5 mm ?Number of attempts: 1 ?Airway Equipment and Method: Stylet ?Placement Confirmation: ETT inserted through vocal cords under direct vision, breath sounds checked- equal and bilateral and positive ETCO2 ?Secured at: 23 cm ?Tube secured with: Tape ?Dental Injury: Teeth and Oropharynx as per pre-operative assessment  ? ? ? ? ?

## 2021-11-07 NOTE — Op Note (Signed)
11/07/2021 ? ?PATIENT:  Bruce Burton  56 y.o. male ? ?Patient Care Team: ?Vernie Shanks, MD as PCP - General (Family Medicine) ?Michael Boston, MD as Consulting Physician (General Surgery) ? ?PRE-OPERATIVE DIAGNOSIS:  PERIUMBILICAL INCARCERATED ABDOMINAL WALL HERNIA, BILATERAL INGUINAL HERNIAS ? ?POST-OPERATIVE DIAGNOSIS:   ?Right Indirect Inguinal hernia ?Left Direct Inguinal Hernia  ?Left femoral hernia ?Incarcerated Ventral Hernia  ? ?PROCEDURE:   ?-LAPAROSCOPIC REPAIR OF  PERIUMBILICAL INCARCERATED ABDOMINAL WALL HERNIA WITH MESH ?-LAPAROSCOPIC BILATERAL INGUINAL HERNIA REPAIRS WITH MESH ?-TAP BLOCK - BILATERAL ? ?SURGEON:  Adin Hector, MD ? ?ASSISTANT:  Vernell Leep, MD, [facility] ?I was personally present during the entire procedure and immediately available throughout the entire procedure, as documented in my operative note. ? ?ANESTHESIA:    ? ?General ? ?Regional TRANSVERSUS ABDOMINIS PLANE (TAP) nerve block for perioperative & postoperative pain control provided with liposomal bupivacaine (Experel) mixed with 0.25% bupivacaine as a Bilateral TAP block x 2mL each side at the level of the transverse abdominis & preperitoneal spaces along the flank at the anterior axillary line, from subcostal ridge to iliac crest under laparoscopic guidance  ? ?EBL:  Total I/O ?In: 161.5 [IV Piggyback:161.5] ?Out: 185 [Urine:175; Blood:10]  Per anesthesia record ? ?Delay start of Pharmacological VTE agent (>24hrs) due to surgical blood loss or risk of bleeding:  no ? ?DRAINS: none  ? ?SPECIMEN:  No Specimen ? ?DISPOSITION OF SPECIMEN:  N/A ? ?COUNTS:  YES ? ?PLAN OF CARE: Discharge to home after PACU ? ?PATIENT DISPOSITION:  PACU - hemodynamically stable. ? ?INDICATION: Pleasant patient has developed a ventral wall abdominal hernia.  Inguinal hernias noted as well.  Recommendation was made for surgical repair ? ?The anatomy & physiology of the abdominal wall was discussed. The pathophysiology of hernias was discussed.  Natural history risks without surgery including progeressive enlargement, pain, incarceration & strangulation was discussed. Contributors to complications such as smoking, obesity, diabetes, prior surgery, etc were discussed.  ?I feel the risks of no intervention will lead to serious problems that outweigh the operative risks; therefore, I recommended surgery to reduce and repair the hernia. I explained laparoscopic techniques with possible need for an open approach. I noted the probable use of mesh to patch and/or buttress the hernia repair. ? ?Risks such as bleeding, infection, abscess, need for further treatment, heart attack, death, and other risks were discussed. I noted a good likelihood this will help address the problem. Goals of post-operative recovery were discussed as well. Possibility that this will not correct all symptoms was explained. I stressed the importance of low-impact activity, aggressive pain control, avoiding constipation, & not pushing through pain to minimize risk of post-operative chronic pain or injury. Possibility of reherniation especially with smoking, obesity, diabetes, immunosuppression, and other health conditions was discussed. We will work to minimize complications.  ? ?An educational handout further explaining the pathology & treatment options was given as well. Questions were answered. The patient expresses understanding & wishes to proceed with surgery.  ? ?OR FINDINGS:  ? ?Periumbilical ventral hernia incarcerated with omentum.  4 x 4 centimeter defect. ? ?Type of ventral wall repair: Laparoscopic underlay repair.  Primary repair of largest hernia ?  ?Placement of mesh: Centrally intraperitoneal with edges tucked into RECTRORECTUS & preperitoneal space ? ?Name of mesh: Bard Ventralight dual sided (polypropylene / Seprafilm) ? ?Size of mesh: 23x17cm ? ?Orientation: Transverse ? ?Mesh overlap:  5-7cm ? ?Right indirect inguinal hernia with some femoral canal laxity but no  definite hernia.  No  direct space nor obturator hernias. ? ?On the left side, subtle but definite direct space inguinal hernia.  Small left femoral hernia as well.  No indirect nor obturator hernias ? ?DESCRIPTION:  ? ?The patient was identified & brought into the operating room. The patient was positioned supine with arms tucked. SCDs were active during the entire case. The patient underwent general anesthesia without any difficulty.  The abdomen was prepped and draped in a sterile fashion. The patient's bladder was emptied.  A Surgical Timeout confirmed our plan. ? ?I made a transverse incision through the inferior umbilical fold.  I made a small transverse nick through the anterior rectus fascia contralateral to the inguinal hernia side and placed a 0-vicryl stitch through the fascia.  I placed a Hasson trocar into the preperitoneal plane.  Entry was clean.  We induced carbon dioxide insufflation. Camera inspection revealed no injury.  I used a 61mm angled scope to bluntly free the peritoneum off the infraumbilical anterior abdominal wall.  I created enough of a preperitoneal pocket to place 57mm ports into the right & left mid-abdomen into this preperitoneal cavity. ? ?I focused attention on the RIGHT pelvis since that was the dominant hernia side.   I used blunt & focused sharp dissection to free the peritoneum off the flank and down to the pubic rim.  I freed the anteriolateral bladder wall off the anteriolateral pelvic wall, sparing midline attachments.   I located a swath of peritoneum going into a hernia fascial defect at the  internal ring consistent with  an indirect inguinal hernia..  I gradually freed the peritoneal hernia sac off safely and reduced it into the preperitoneal space.  I freed the peritoneum off the spermatic vessels & vas deferens.  I freed peritoneum off the retroperitoneum along the psoas muscle.  Spermatic cord lipoma was dissected away & removed.  I checked & assured hemostasis.     ? ?I turned attention on the opposite  LEFT pelvis.  I did dissection in a similar, mirror-image fashion. The patient had a direct space inguinal hernia.  A subtle but definite left femoral hernia as well.   Spermatic cord lipoma was dissected away & removed.    I checked & assured hemostasis.  Peritoneal repair with 2-0 Vicryl using laparoscopic intracorporeal suturing. ? ?I chose sheets of  lightweight Bard soft mesh 15 x 15 cm , one for each side.  I cut a single sigmoid-shaped slit ~6cm from a corner of each mesh.  I placed the meshes into the preperitoneal space & laid them as overlapping diamonds such that at the inferior points, a 6x6 cm corner flap rested in the true anterolateral pelvis, covering the obturator & femoral foramina.   I allowed the bladder to return to the pubis, this helping tuck the corners of the mesh in the anteriolateral pelvis.  The medial corners overlapped each other across midline cephalad to the pubic rim.   Given the numerous hernias of moderate size, I placed a third 15x15cm mesh in the center as a vertical diamond.  The lateral wings of the mesh overlap across the direct spaces and internal rings where the dominant hernias were.  This provided good coverage and reinforcement of the hernia repairs.  Because of the central mesh placement with good overlap, I did not place any tacks.  ? ?We then turned attention to the ventral wall hernia.  Abdominal entry was gained using optical entry technique in the left upper abdomen. Entry was clean. I  induced carbon dioxide insufflation. Camera inspection revealed no injury. Extra ports were carefully placed under direct laparoscopic visualization.  ? ?I could see large swath of omentum going up periumbilically consistent with an incarcerated hernia on the parietal peritoneum under the abdominal wall.  We took effort to free adhesions around the hernia and eventually reduce the large swath of greater omentum out that it filled the cavity  about the size of a baseball.  Fascial defect was not as large as the main mass. We freed off the falciform ligament and central peritoneum to expose the retrorectus fascia   I made sure hemostasis was good.  I mapped o

## 2021-11-07 NOTE — Anesthesia Preprocedure Evaluation (Signed)
Anesthesia Evaluation  ?Patient identified by MRN, date of birth, ID band ?Patient awake ? ? ? ?Reviewed: ?Allergy & Precautions, NPO status , Patient's Chart, lab work & pertinent test results ? ?Airway ?Mallampati: II ? ?TM Distance: >3 FB ?Neck ROM: Full ? ? ? Dental ?no notable dental hx. ? ?  ?Pulmonary ?neg pulmonary ROS, former smoker,  ?  ?Pulmonary exam normal ?breath sounds clear to auscultation ? ? ? ? ? ? Cardiovascular ?hypertension, Pt. on medications ?Normal cardiovascular exam ?Rhythm:Regular Rate:Normal ? ? ?  ?Neuro/Psych ?negative neurological ROS ? negative psych ROS  ? GI/Hepatic ?Neg liver ROS, GERD  ,  ?Endo/Other  ?negative endocrine ROS ? Renal/GU ?negative Renal ROS  ?negative genitourinary ?  ?Musculoskeletal ?negative musculoskeletal ROS ?(+)  ? Abdominal ?  ?Peds ?negative pediatric ROS ?(+)  Hematology ?negative hematology ROS ?(+)   ?Anesthesia Other Findings ? ? Reproductive/Obstetrics ?negative OB ROS ? ?  ? ? ? ? ? ? ? ? ? ? ? ? ? ?  ?  ? ? ? ? ? ? ? ? ?Anesthesia Physical ?Anesthesia Plan ? ?ASA: 2 ? ?Anesthesia Plan: General  ? ?Post-op Pain Management: Tylenol PO (pre-op)*  ? ?Induction: Intravenous ? ?PONV Risk Score and Plan: 2 and Ondansetron, Dexamethasone, Treatment may vary due to age or medical condition and Midazolam ? ?Airway Management Planned: Oral ETT ? ?Additional Equipment:  ? ?Intra-op Plan:  ? ?Post-operative Plan: Extubation in OR ? ?Informed Consent: I have reviewed the patients History and Physical, chart, labs and discussed the procedure including the risks, benefits and alternatives for the proposed anesthesia with the patient or authorized representative who has indicated his/her understanding and acceptance.  ? ? ? ?Dental advisory given ? ?Plan Discussed with: CRNA and Surgeon ? ?Anesthesia Plan Comments:   ? ? ? ? ? ? ?Anesthesia Quick Evaluation ? ?

## 2021-11-07 NOTE — H&P (Signed)
?REFERRING PHYSICIAN:  Redmond BasemanWong, Francis Patrick, * ?  ?Patient Care Team: ?Redmond BasemanWong, Francis Patrick, MD as PCP - General (Family Medicine) ?  ?PROVIDER:  Jarrett SohoSTEVEN CUMMINS Elery Cadenhead, MD ?  ?[facility] MRN: [id number] ?DOB: 12/14/1965 ?DATE OF ENCOUNTER: 05/12/2021 ?  ?Subjective  ?  ?Chief Complaint: Umbilical Hernia ?  ?  ?  ?History of Present Illness: ?Bruce Burton is a 56 y.o. male who is seen today as an office consultation at the request of Dr. Modesto CharonWong for evaluation of Umbilical Hernia ?.   ?  ?Pleasant active male.  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  He has had a hernia around his bellybutton for many years.  It is now it all the time.  Its become more uncomfortable.  It is chronically stuck and protruding.  Based on concerns he discussed with his primary care physician.  Surgical consultation offered.  He has had no prior abdominal surgery.  Usually has a bowel movement once or twice a day.  No diabetes.  No sleep apnea.  He used to smoke but quit several years ago.  No cardiac or pulmonary issues.  No chronic pain issues.  He does have some hypertension but that is well controlled.  He had a vasectomy but no other surgeries.  No history of MRSA or skin issues.  No history of any nocturia or prostate issues. ?  ?  ?  ?  ?Medical History: ?Past Medical History  ?History reviewed. No pertinent past medical history.  ? ?  ?There is no problem list on file for this patient. ?  ?  ?Past Surgical History  ?     ?Past Surgical History:  ?Procedure Laterality Date  ? VASECTOMY      ?  ?  ?  ?Allergies  ?    ?Allergies  ?Allergen Reactions  ? Keflex [Cephalexin] Other (See Comments)  ? Sulfa (Sulfonamide Antibiotics) Other (See Comments)  ?  ?  ?  ?      ?Current Outpatient Medications on File Prior to Visit  ?Medication Sig Dispense Refill  ? amLODIPine (NORVASC) 10 MG tablet Take 10 mg by mouth once daily      ? atorvastatin (LIPITOR) 20 MG tablet Take 20 mg by mouth once daily      ? hydroCHLOROthiazide (HYDRODIURIL) 25 MG  tablet TAKE 1 TABLET BY MOUTH EVERY DAY IN THE MORNING EVERY MORNING 90 DAYS      ? losartan (COZAAR) 100 MG tablet TAKE 1 TABLET BY MOUTH EVERY DAY FOR 90 DAYS 90 DAYS      ?  ?No current facility-administered medications on file prior to visit.  ?  ?  ?Family History  ?     ?Family History  ?Problem Relation Age of Onset  ? Obesity Mother    ? Breast cancer Mother    ? Stroke Father    ? High blood pressure (Hypertension) Father    ? Hyperlipidemia (Elevated cholesterol) Father    ?  ?  ?  ?Social History  ?  ?    ?Tobacco Use  ?Smoking Status Former Smoker  ? Quit date: 2004  ? Years since quitting: 18.7  ?Smokeless Tobacco Never Used  ?  ?  ?Social History  ?Social History  ?  ?     ?Socioeconomic History  ? Marital status: Unknown  ?Tobacco Use  ? Smoking status: Former Smoker  ?    Quit date: 2004  ?    Years since quitting: 18.7  ?  Smokeless tobacco: Never Used  ?Substance and Sexual Activity  ? Alcohol use: Yes  ?    Comment: 2/3 weeks  ? Drug use: Never  ?  ?  ?  ?############################################################ ?  ?  ?Review of Systems: ?A complete review of systems (ROS) was obtained from the patient.  I have reviewed this information and discussed as appropriate with the patient.  See HPI as well for other pertinent ROS. ?  ?Constitutional:  No fevers, chills, sweats.  Weight stable ?Eyes:  No vision changes, No discharge ?HENT:  No sore throats, nasal drainage ?Lymph: No neck swelling, No bruising easily ?Pulmonary:  No cough, productive sputum ?CV: No orthopnea, PND  Patient walks his dog 3 miles without difficulty.  No exertional chest/neck/shoulder/arm pain. ?  ?GI:  No personal nor family history of GI/colon cancer, inflammatory bowel disease, irritable bowel syndrome, allergy such as Celiac Sprue, dietary/dairy problems, colitis, ulcers nor gastritis.  No recent sick contacts/gastroenteritis.  No travel outside the country.  No changes in diet. ?  ?Renal: No UTIs, No hematuria ?Genital:   No drainage, bleeding, masses ?Musculoskeletal: No severe joint pain.  Good ROM major joints ?Skin:  No sores or lesions ?Heme/Lymph:  No easy bleeding.  No swollen lymph nodes ?  ?Objective:  ?  ?   ?Vitals:  ?  05/12/21 0902  ?BP: (!) 140/96  ?Pulse: 93  ?Temp: 36.8 ?C (98.2 ?F)  ?SpO2: 93%  ?Weight: (!) 121 kg (266 lb 12.8 oz)  ?Height: 180.3 cm (5\' 11" )  ?  ?  ?Body mass index is 37.21 kg/m?. ?  ?PHYSICAL EXAM: ?  ?  ?Constitutional: Not cachectic.  Hygeine adequate.  Vitals signs as above.   ?Eyes: Pupils reactive, normal extraocular movements. Sclera nonicteric ?Neuro: CN II-XII intact.  No major focal sensory defects.  No major motor deficits. ?Lymph: No head/neck/groin lymphadenopathy ?Psych:  No severe agitation.  No severe anxiety.  Judgment & insight Adequate, Oriented x4, ?HENT: Normocephalic, Mucus membranes moist.  No thrush.   ?Neck: Supple, No tracheal deviation.  No obvious thyromegaly ?Chest: No pain to chest wall compression.  Good respiratory excursion.  No audible wheezing ?CV:  Pulses intact.   Regular rhythm.  No major extremity edema ?  ?Abdomen:  Obese Hernia: 7cm incarcerated mass at umbilicus c/w unc umb hernia. Diastasis recti: Large supraumbilical midline. ?Soft.   Nondistended.  Nontender.  No hepatomegaly.  No splenomegaly ?  ?Gen:  Inguinal hernia: Present at: left - reducible.  poss small RIH on cough only.  Inguinal lymph nodes: without lymphadenopathy.   ?  ?Rectal: (Deferred) ?  ?Ext: No obvious deformity or contracture.  Edema: Not present.  No cyanosis ?Skin: No major subcutaneous nodules.  Warm and dry ?Musculoskeletal: Severe joint rigidity not present.  No obvious clubbing.  No digital petechiae.    ?  ?  ?Labs, Imaging and Diagnostic Testing: ?  ?Not applicable ?  ?PRIOR NOTES   ?  ?Not applicable ?  ?SURGERY NOTES: ?  ?Not applicable ?  ?PATHOLOGY: ?  ?Not applicable ?  ?  ?Assessment and Plan:  ?DIAGNOSES: ?  ?Diagnoses and all orders for this visit: ?  ?Incarcerated  ventral hernia ?  ?Diastasis recti ?  ?Non-recurrent bilateral inguinal hernia without obstruction or gangrene ?  ?BMI 37.0-37.9, adult ?  ?  ?  ?ASSESSMENT/PLAN ?  ?Chronically incarcerated periumbilical ventral hernia in the setting of obesity and diastases recti.  Definite left inguinal hernia and probable right inguinal hernia  as well. ?  ?I think he would benefit from surgery to repair.  I would err on the side of fixing all regions at once.  Would start out with fixing inguinal region and then focus on the periumbilical region.  He no longer smokes which would be the biggest risk factor for complications and recurrence.  However he is significantly obese.  Therefore we will most likely have to hedge and place larger sheets of mesh.  I did caution he will need several weeks before he gets back to most daily activities in 6 weeks before unrestricted.  He is not severely debilitated by these at this time but things have gotten worse this year and now the hernia is stuck.  Most likely has omentum.  We will work to coordinate a convenient time. ?  ?The anatomy & physiology of the abdominal wall was discussed.  The pathophysiology of hernias was discussed.  Natural history risks without surgery including progeressive enlargement, pain, incarceration, & strangulation was discussed.   Contributors to complications such as smoking, obesity, diabetes, prior surgery, etc were discussed.  ?  ?I feel the risks of no intervention will lead to serious problems that outweigh the operative risks; therefore, I recommended surgery to reduce and repair the hernia.  I explained laparoscopic techniques with possible need for an open approach.  I noted the probable use of mesh to patch and/or buttress the hernia repair ?  ?Risks such as bleeding, infection, abscess, need for further treatment, injury to other organs, need for repair of tissues / organs, stroke, heart attack, death, and other risks were discussed.  I noted a good  likelihood this will help address the problem.   Goals of post-operative recovery were discussed as well.  Possibility that this will not correct all symptoms was explained.  I stressed the importance of low-

## 2021-11-07 NOTE — Anesthesia Postprocedure Evaluation (Signed)
Anesthesia Post Note ? ?Patient: Bruce Burton ? ?Procedure(s) Performed: LAPAROSCOPIC VENTRAL HERNIA REPAIR WITH MESH; REMOVAL OF SKIN TAG LEFT GROIN ?LAPAROSCOPIC BILATERAL INGUINAL HERNIA REPAIR WITH MESH (Bilateral) ? ?  ? ?Patient location during evaluation: PACU ?Anesthesia Type: General ?Level of consciousness: awake and alert ?Pain management: pain level controlled ?Vital Signs Assessment: post-procedure vital signs reviewed and stable ?Respiratory status: spontaneous breathing, nonlabored ventilation, respiratory function stable and patient connected to nasal cannula oxygen ?Cardiovascular status: blood pressure returned to baseline and stable ?Postop Assessment: no apparent nausea or vomiting ?Anesthetic complications: no ? ? ?No notable events documented. ? ?Last Vitals:  ?Vitals:  ? 11/07/21 1837 11/07/21 1845  ?BP:  (!) 142/84  ?Pulse: 88 93  ?Resp: 20 20  ?Temp:    ?SpO2: 95% 96%  ?  ?Last Pain:  ?Vitals:  ? 11/07/21 1837  ?TempSrc:   ?PainSc: 3   ? ? ?  ?  ?  ?  ?  ?  ? ?Laster Appling S ? ? ? ? ?

## 2021-11-07 NOTE — Anesthesia Procedure Notes (Signed)
Date/Time: 11/07/2021 6:15 PM ?Performed by: Minerva Ends, CRNA ?Oxygen Delivery Method: Simple face mask ?Placement Confirmation: positive ETCO2 and breath sounds checked- equal and bilateral ?Dental Injury: Teeth and Oropharynx as per pre-operative assessment  ? ? ? ? ?

## 2021-11-07 NOTE — Interval H&P Note (Signed)
History and Physical Interval Note: ? ?11/07/2021 ?2:04 PM ? ?Bruce Burton  has presented today for surgery, with the diagnosis of PERIUMBILICAL INCARCERATED ABDOMINAL WALL HERNIA, BILATERAL INGUINAL HERNIAS.  The various methods of treatment have been discussed with the patient and family. After consideration of risks, benefits and other options for treatment, the patient has consented to  Procedure(s): ?LAPAROSCOPIC VENTRAL HERNIA REPAIR WITH MESH (N/A) ?LAPAROSCOPIC BILATERAL INGUINAL HERNIA REPAIR WITH MESH (Bilateral) as a surgical intervention.  The patient's history has been reviewed, patient examined, no change in status, stable for surgery.  I have reviewed the patient's chart and labs.  Questions were answered to the patient's satisfaction.   ? ?I have re-reviewed the the patient's records, history, medications, and allergies.  I have re-examined the patient.  I again discussed intraoperative plans and goals of post-operative recovery.  The patient agrees to proceed. ? ?Bruce Burton  ?1966-07-26 ?[id number] ? ?Patient Care Team: ?Ileana Ladd, MD as PCP - General (Family Medicine) ?Karie Soda, MD as Consulting Physician (General Surgery) ? ?There are no problems to display for this patient. ? ? ?Past Medical History:  ?Diagnosis Date  ? GERD (gastroesophageal reflux disease)   ? History of hiatal hernia   ? History of kidney stones   ? Hypertension   ? Pre-diabetes   ? ? ?Past Surgical History:  ?Procedure Laterality Date  ? VASECTOMY  2004  ? ? ?Social History  ? ?Socioeconomic History  ? Marital status: Married  ?  Spouse name: Not on file  ? Number of children: Not on file  ? Years of education: Not on file  ? Highest education level: Not on file  ?Occupational History  ? Not on file  ?Tobacco Use  ? Smoking status: Former  ?  Packs/day: 0.50  ?  Years: 10.00  ?  Pack years: 5.00  ?  Types: Cigarettes  ?  Quit date: 2000  ?  Years since quitting: 23.2  ? Smokeless tobacco: Never  ?Vaping Use  ?  Vaping Use: Never used  ?Substance and Sexual Activity  ? Alcohol use: Yes  ?  Alcohol/week: 4.0 standard drinks  ?  Types: 4 Standard drinks or equivalent per week  ? Drug use: Never  ? Sexual activity: Not on file  ?Other Topics Concern  ? Not on file  ?Social History Narrative  ? Not on file  ? ?Social Determinants of Health  ? ?Financial Resource Strain: Not on file  ?Food Insecurity: Not on file  ?Transportation Needs: Not on file  ?Physical Activity: Not on file  ?Stress: Not on file  ?Social Connections: Not on file  ?Intimate Partner Violence: Not on file  ? ? ?History reviewed. No pertinent family history. ? ?Medications Prior to Admission  ?Medication Sig Dispense Refill Last Dose  ? amLODipine (NORVASC) 10 MG tablet Take 10 mg by mouth daily.   11/07/2021 at 0900  ? atorvastatin (LIPITOR) 20 MG tablet Take 20 mg by mouth daily.   11/06/2021  ? cholecalciferol (VITAMIN D3) 25 MCG (1000 UNIT) tablet Take 1,000 Units by mouth daily.   11/06/2021  ? hydrochlorothiazide (HYDRODIURIL) 25 MG tablet Take 25 mg by mouth daily.   11/06/2021  ? losartan (COZAAR) 100 MG tablet Take 100 mg by mouth daily.   11/06/2021  ? Multiple Vitamins-Minerals (MULTIVITAMIN WITH MINERALS) tablet Take 1 tablet by mouth daily.   11/06/2021  ? ? ?Current Facility-Administered Medications  ?Medication Dose Route Frequency Provider Last Rate Last Admin  ?  bupivacaine liposome (EXPAREL) 1.3 % injection 266 mg  20 mL Infiltration Once Karie Soda, MD      ? Chlorhexidine Gluconate Cloth 2 % PADS 6 each  6 each Topical Once Karie Soda, MD      ? clindamycin (CLEOCIN) IVPB 900 mg  900 mg Intravenous On Call to OR Karie Soda, MD      ? And  ? gentamicin (GARAMYCIN) 460 mg in dextrose 5 % 100 mL IVPB  5 mg/kg (Adjusted) Intravenous On Call to OR Karie Soda, MD      ? lactated ringers infusion   Intravenous Continuous Ellender, Catheryn Bacon, MD 10 mL/hr at 11/07/21 1215 New Bag at 11/07/21 1215  ?  ? ?Allergies  ?Allergen Reactions  ?  Cephalexin Rash  ? Sulfa Antibiotics Rash  ? ? ?BP (!) 142/86   Pulse (!) 106   Temp 98.5 ?F (36.9 ?C) (Oral)   Resp 17   Ht 5\' 11"  (1.803 m)   Wt 115.8 kg   BMI 35.62 kg/m?  ? ?Labs: ?No results found for this or any previous visit (from the past 48 hour(s)). ? ?Imaging / Studies: ?No results found. ?  ?. , M.D., F.A.C.S. ?Gastrointestinal and Minimally Invasive Surgery ?[facility] ?[address] ?[place], [place] ?[phone] Main / Paging ? ?11/07/2021 ?2:04 PM ? ? ? ?11/09/2021 ? ? ?

## 2021-11-07 NOTE — Discharge Instructions (Addendum)
HERNIA REPAIR: POST OP INSTRUCTIONS  ######################################################################  EAT Gradually transition to a high fiber diet with a fiber supplement over the next few weeks after discharge.  Start with a pureed / full liquid diet (see below)  WALK Walk an hour a day.  Control your pain to do that.    CONTROL PAIN Control pain so that you can walk, sleep, tolerate sneezing/coughing, and go up/down stairs.  HAVE A BOWEL MOVEMENT DAILY Keep your bowels regular to avoid problems.  OK to try a laxative to override constipation.  OK to use an antidairrheal to slow down diarrhea.  Call if not better after 2 tries  CALL IF YOU HAVE PROBLEMS/CONCERNS Call if you are still struggling despite following these instructions. Call if you have concerns not answered by these instructions  ######################################################################    DIET: Follow a light bland diet & liquids the first 24 hours after arrival home, such as soup, liquids, starches, etc.  Be sure to drink plenty of fluids.  Quickly advance to a usual solid diet within a few days.  Avoid fast food or heavy meals as your are more likely to get nauseated or have irregular bowels.  A low-fat, high-fiber diet for the rest of your life is ideal.   Take your usually prescribed home medications unless otherwise directed.  PAIN CONTROL: Pain is best controlled by a usual combination of three different methods TOGETHER: Ice/Heat Over the counter pain medication Prescription pain medication Most patients will experience some swelling and bruising around the hernia(s) such as the bellybutton, groins, or old incisions.  Ice packs or heating pads (30-60 minutes up to 6 times a day) will help. Use ice for the first few days to help decrease swelling and bruising, then switch to heat to help relax tight/sore spots and speed recovery.  Some people prefer to use ice alone, heat alone, alternating  between ice & heat.  Experiment to what works for you.  Swelling and bruising can take several weeks to resolve.   It is helpful to take an over-the-counter pain medication regularly for the first few weeks.  Choose one of the following that works best for you: Naproxen (Aleve, etc)  Two 220mg tabs twice a day Ibuprofen (Advil, etc) Three 200mg tabs four times a day (every meal & bedtime) Acetaminophen (Tylenol, etc) 325-650mg four times a day (every meal & bedtime) A  prescription for pain medication should be given to you upon discharge.  Take your pain medication as prescribed.  If you are having problems/concerns with the prescription medicine (does not control pain, nausea, vomiting, rash, itching, etc), please call us [phone] to see if we need to switch you to a different pain medicine that will work better for you and/or control your side effect better. If you need a refill on your pain medication, please contact your pharmacy.  They will contact our office to request authorization. Prescriptions will not be filled after 5 pm or on week-ends.  Avoid getting constipated.  Between the surgery and the pain medications, it is common to experience some constipation.  Increasing fluid intake and taking a fiber supplement (such as Metamucil, Citrucel, FiberCon, MiraLax, etc) 1-2 times a day regularly will usually help prevent this problem from occurring.  A mild laxative (prune juice, Milk of Magnesia, MiraLax, etc) should be taken according to package directions if there are no bowel movements after 48 hours.    Wash / shower every day.  You may shower over the dressings   as they are waterproof.    Remove your waterproof bandages, skin tapes, and other bandages 3 days after surgery. You may replace a dressing/Band-Aid to cover the incision for comfort if you wish. You may leave the incisions open to air.  You may replace a dressing/Band-Aid to cover an incision for comfort if you wish.  Continue  to shower over incision(s) after the dressing is off.  ACTIVITIES as tolerated:   You may resume regular (light) daily activities beginning the next day--such as daily self-care, walking, climbing stairs--gradually increasing activities as tolerated.  Control your pain so that you can walk an hour a day.  If you can walk 30 minutes without difficulty, it is safe to try more intense activity such as jogging, treadmill, bicycling, low-impact aerobics, swimming, etc. Save the most intensive and strenuous activity for last such as sit-ups, heavy lifting, contact sports, etc  Refrain from any heavy lifting or straining until you are off narcotics for pain control.   DO NOT PUSH THROUGH PAIN.  Let pain be your guide: If it hurts to do something, don't do it.  Pain is your body warning you to avoid that activity for another week until the pain goes down. You may drive when you are no longer taking prescription pain medication, you can comfortably wear a seatbelt, and you can safely maneuver your car and apply brakes. You may have sexual intercourse when it is comfortable.   FOLLOW UP in our office Please call [facility] at [phone] to set up an appointment to see your surgeon in the office for a follow-up appointment approximately 2-3 weeks after your surgery. Make sure that you call for this appointment the day you arrive home to insure a convenient appointment time.  9.  If you have disability of FMLA / Family leave forms, please bring the forms to the office for processing.  (do not give to your surgeon).  WHEN TO CALL US [phone]: Poor pain control Reactions / problems with new medications (rash/itching, nausea, etc)  Fever over 101.5 F (38.5 C) Inability to urinate Nausea and/or vomiting Worsening swelling or bruising Continued bleeding from incision. Increased pain, redness, or drainage from the incision   The clinic staff is available to answer your questions during regular business  hours (8:30am-5pm).  Please don't hesitate to call and ask to speak to one of our nurses for clinical concerns.   If you have a medical emergency, go to the nearest emergency room or call 911.  A surgeon from [facility] is always on call at the hospitals in [place]  [facility] [address], [address], [place], [place]  [address] ?  [address] [address], , [place]   [address] MAIN: [phone] ? TOLL FREE: [phone] ? FAX: [phone] [email or web address]  

## 2021-11-09 ENCOUNTER — Encounter (HOSPITAL_COMMUNITY): Payer: Self-pay | Admitting: Surgery

## 2021-11-19 ENCOUNTER — Encounter (HOSPITAL_COMMUNITY): Payer: Self-pay | Admitting: Surgery

## 2022-03-27 DIAGNOSIS — Z6837 Body mass index (BMI) 37.0-37.9, adult: Secondary | ICD-10-CM | POA: Diagnosis not present

## 2022-03-27 DIAGNOSIS — I10 Essential (primary) hypertension: Secondary | ICD-10-CM | POA: Diagnosis not present

## 2022-03-27 DIAGNOSIS — R7303 Prediabetes: Secondary | ICD-10-CM | POA: Diagnosis not present

## 2022-04-27 DIAGNOSIS — Z23 Encounter for immunization: Secondary | ICD-10-CM | POA: Diagnosis not present

## 2022-04-27 DIAGNOSIS — R319 Hematuria, unspecified: Secondary | ICD-10-CM | POA: Diagnosis not present

## 2022-08-24 DIAGNOSIS — R509 Fever, unspecified: Secondary | ICD-10-CM | POA: Diagnosis not present

## 2022-08-24 DIAGNOSIS — R5383 Other fatigue: Secondary | ICD-10-CM | POA: Diagnosis not present

## 2022-08-24 DIAGNOSIS — J101 Influenza due to other identified influenza virus with other respiratory manifestations: Secondary | ICD-10-CM | POA: Diagnosis not present

## 2022-08-24 DIAGNOSIS — R051 Acute cough: Secondary | ICD-10-CM | POA: Diagnosis not present

## 2022-08-24 DIAGNOSIS — Z03818 Encounter for observation for suspected exposure to other biological agents ruled out: Secondary | ICD-10-CM | POA: Diagnosis not present

## 2022-11-23 DIAGNOSIS — B351 Tinea unguium: Secondary | ICD-10-CM | POA: Diagnosis not present

## 2022-11-23 DIAGNOSIS — R7303 Prediabetes: Secondary | ICD-10-CM | POA: Diagnosis not present

## 2022-11-23 DIAGNOSIS — I10 Essential (primary) hypertension: Secondary | ICD-10-CM | POA: Diagnosis not present

## 2022-11-23 DIAGNOSIS — E785 Hyperlipidemia, unspecified: Secondary | ICD-10-CM | POA: Diagnosis not present

## 2022-11-23 DIAGNOSIS — E559 Vitamin D deficiency, unspecified: Secondary | ICD-10-CM | POA: Diagnosis not present

## 2022-11-23 DIAGNOSIS — Z0000 Encounter for general adult medical examination without abnormal findings: Secondary | ICD-10-CM | POA: Diagnosis not present

## 2022-11-23 DIAGNOSIS — Z125 Encounter for screening for malignant neoplasm of prostate: Secondary | ICD-10-CM | POA: Diagnosis not present

## 2022-11-23 DIAGNOSIS — Z23 Encounter for immunization: Secondary | ICD-10-CM | POA: Diagnosis not present

## 2023-03-02 DIAGNOSIS — R7303 Prediabetes: Secondary | ICD-10-CM | POA: Diagnosis not present

## 2023-03-02 DIAGNOSIS — B351 Tinea unguium: Secondary | ICD-10-CM | POA: Diagnosis not present

## 2023-03-02 DIAGNOSIS — Z23 Encounter for immunization: Secondary | ICD-10-CM | POA: Diagnosis not present

## 2023-03-02 DIAGNOSIS — I10 Essential (primary) hypertension: Secondary | ICD-10-CM | POA: Diagnosis not present

## 2023-03-02 DIAGNOSIS — E785 Hyperlipidemia, unspecified: Secondary | ICD-10-CM | POA: Diagnosis not present

## 2023-06-03 DIAGNOSIS — Z23 Encounter for immunization: Secondary | ICD-10-CM | POA: Diagnosis not present

## 2023-06-03 DIAGNOSIS — E785 Hyperlipidemia, unspecified: Secondary | ICD-10-CM | POA: Diagnosis not present

## 2023-06-03 DIAGNOSIS — B351 Tinea unguium: Secondary | ICD-10-CM | POA: Diagnosis not present

## 2023-06-03 DIAGNOSIS — I10 Essential (primary) hypertension: Secondary | ICD-10-CM | POA: Diagnosis not present

## 2023-06-03 DIAGNOSIS — R7303 Prediabetes: Secondary | ICD-10-CM | POA: Diagnosis not present

## 2023-12-10 DIAGNOSIS — Z125 Encounter for screening for malignant neoplasm of prostate: Secondary | ICD-10-CM | POA: Diagnosis not present

## 2023-12-10 DIAGNOSIS — E559 Vitamin D deficiency, unspecified: Secondary | ICD-10-CM | POA: Diagnosis not present

## 2023-12-10 DIAGNOSIS — I10 Essential (primary) hypertension: Secondary | ICD-10-CM | POA: Diagnosis not present

## 2023-12-10 DIAGNOSIS — E785 Hyperlipidemia, unspecified: Secondary | ICD-10-CM | POA: Diagnosis not present

## 2023-12-10 DIAGNOSIS — Z6838 Body mass index (BMI) 38.0-38.9, adult: Secondary | ICD-10-CM | POA: Diagnosis not present

## 2023-12-10 DIAGNOSIS — Z0000 Encounter for general adult medical examination without abnormal findings: Secondary | ICD-10-CM | POA: Diagnosis not present

## 2023-12-10 DIAGNOSIS — B351 Tinea unguium: Secondary | ICD-10-CM | POA: Diagnosis not present

## 2023-12-10 DIAGNOSIS — K5730 Diverticulosis of large intestine without perforation or abscess without bleeding: Secondary | ICD-10-CM | POA: Diagnosis not present

## 2023-12-10 DIAGNOSIS — R7303 Prediabetes: Secondary | ICD-10-CM | POA: Diagnosis not present

## 2024-07-12 DIAGNOSIS — R7303 Prediabetes: Secondary | ICD-10-CM | POA: Diagnosis not present

## 2024-07-12 DIAGNOSIS — B351 Tinea unguium: Secondary | ICD-10-CM | POA: Diagnosis not present

## 2024-07-12 DIAGNOSIS — I10 Essential (primary) hypertension: Secondary | ICD-10-CM | POA: Diagnosis not present

## 2024-07-12 DIAGNOSIS — E559 Vitamin D deficiency, unspecified: Secondary | ICD-10-CM | POA: Diagnosis not present

## 2024-07-12 DIAGNOSIS — E785 Hyperlipidemia, unspecified: Secondary | ICD-10-CM | POA: Diagnosis not present
